# Patient Record
Sex: Female | Born: 2005 | Race: White | Hispanic: No | Marital: Single | Smoking: Never smoker
Health system: Southern US, Community
[De-identification: ages and names within clinical notes are randomized; demographics above are authoritative.]

## PROBLEM LIST (undated history)

## (undated) ENCOUNTER — Ambulatory Visit: Payer: Self-pay | Source: Home / Self Care

## (undated) DIAGNOSIS — R42 Dizziness and giddiness: Secondary | ICD-10-CM

## (undated) DIAGNOSIS — R232 Flushing: Secondary | ICD-10-CM

## (undated) DIAGNOSIS — R634 Abnormal weight loss: Secondary | ICD-10-CM

## (undated) DIAGNOSIS — R002 Palpitations: Secondary | ICD-10-CM

## (undated) DIAGNOSIS — R55 Syncope and collapse: Secondary | ICD-10-CM

## (undated) DIAGNOSIS — F419 Anxiety disorder, unspecified: Secondary | ICD-10-CM

## (undated) HISTORY — DX: Syncope and collapse: R55

## (undated) HISTORY — DX: Abnormal weight loss: R63.4

## (undated) HISTORY — DX: Flushing: R23.2

## (undated) HISTORY — DX: Dizziness and giddiness: R42

## (undated) HISTORY — PX: WISDOM TOOTH EXTRACTION: SHX21

## (undated) HISTORY — DX: Palpitations: R00.2

## (undated) HISTORY — DX: Anxiety disorder, unspecified: F41.9

---
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
## 2023-01-01 DIAGNOSIS — F411 Generalized anxiety disorder: Secondary | ICD-10-CM | POA: Diagnosis not present

## 2023-01-15 DIAGNOSIS — Z419 Encounter for procedure for purposes other than remedying health state, unspecified: Secondary | ICD-10-CM | POA: Diagnosis not present

## 2023-01-17 DIAGNOSIS — F411 Generalized anxiety disorder: Secondary | ICD-10-CM | POA: Diagnosis not present

## 2023-01-27 DIAGNOSIS — F411 Generalized anxiety disorder: Secondary | ICD-10-CM | POA: Diagnosis not present

## 2023-02-03 DIAGNOSIS — F411 Generalized anxiety disorder: Secondary | ICD-10-CM | POA: Diagnosis not present

## 2023-02-15 DIAGNOSIS — Z419 Encounter for procedure for purposes other than remedying health state, unspecified: Secondary | ICD-10-CM | POA: Diagnosis not present

## 2023-02-20 DIAGNOSIS — F321 Major depressive disorder, single episode, moderate: Secondary | ICD-10-CM | POA: Diagnosis not present

## 2023-02-20 DIAGNOSIS — F411 Generalized anxiety disorder: Secondary | ICD-10-CM | POA: Diagnosis not present

## 2023-02-24 DIAGNOSIS — F411 Generalized anxiety disorder: Secondary | ICD-10-CM | POA: Diagnosis not present

## 2023-03-04 DIAGNOSIS — F331 Major depressive disorder, recurrent, moderate: Secondary | ICD-10-CM | POA: Diagnosis not present

## 2023-03-13 DIAGNOSIS — F331 Major depressive disorder, recurrent, moderate: Secondary | ICD-10-CM | POA: Diagnosis not present

## 2023-03-15 DIAGNOSIS — Z419 Encounter for procedure for purposes other than remedying health state, unspecified: Secondary | ICD-10-CM | POA: Diagnosis not present

## 2023-04-21 ENCOUNTER — Ambulatory Visit: Payer: Medicaid Other | Admitting: Family Medicine

## 2023-04-30 ENCOUNTER — Telehealth: Payer: Self-pay

## 2023-04-30 ENCOUNTER — Ambulatory Visit
Admission: EM | Admit: 2023-04-30 | Discharge: 2023-04-30 | Disposition: A | Discharge status: Home / Self Care | Payer: Self-pay | Attending: Physician Assistant | Admitting: Physician Assistant

## 2023-04-30 VITALS — BP 106/74 | HR 91 | Temp 98.2°F | Resp 18 | Ht 65.5 in | Wt 219.1 lb

## 2023-04-30 DIAGNOSIS — Z654 Victim of crime and terrorism: Secondary | ICD-10-CM

## 2023-04-30 DIAGNOSIS — J029 Acute pharyngitis, unspecified: Secondary | ICD-10-CM | POA: Insufficient documentation

## 2023-04-30 DIAGNOSIS — Z9189 Other specified personal risk factors, not elsewhere classified: Secondary | ICD-10-CM | POA: Insufficient documentation

## 2023-04-30 LAB — POCT RAPID STREP A (OFFICE): Rapid Strep A Screen: NEGATIVE
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
## 2023-04-30 NOTE — ED Triage Notes (Signed)
"  Starting about 3 days ago started with scratchy throat, swelling, redness, I did poke a spot on my left tonsil that was swollen and it started bleeding, quickly stopped but not sure what that was". No fever known.

## 2023-05-02 ENCOUNTER — Telehealth: Payer: Self-pay | Admitting: *Deleted

## 2023-05-02 ENCOUNTER — Other Ambulatory Visit: Payer: Self-pay | Admitting: *Deleted

## 2023-05-02 LAB — CULTURE, GROUP A STREP (THRC)
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
## 2023-05-02 NOTE — Progress Notes (Signed)
 Complex Care Management Note  Care Guide Note 05/02/2023 Name: Julie Brady MRN: [id number] DOB: 2005/12/26  Julie Brady is a 18 y.o. year old female who sees Patient, No Pcp Per for primary care. I reached out to Clariza Libbey by phone today to offer complex care management services.  [name] was given information about Complex Care Management services today including:   The Complex Care Management services include support from the care team which includes your Nurse Care Manager, Clinical Social Worker, or Pharmacist.  The Complex Care Management team is here to help remove barriers to the health concerns and goals most important to you. Complex Care Management services are voluntary, and the patient may decline or stop services at any time by request to their care team member.   Complex Care Management Consent Status: Patient agreed to services and verbal consent obtained.   Follow up plan:  Telephone appointment with complex care management team member scheduled for:  05/02/2023  Encounter Outcome:  Patient Scheduled  Thedford Franks, CMA [place]  [facility], [facility] Guide Direct Dial: [phone]  Fax: [phone] Website: [email or web address]

## 2023-05-02 NOTE — Progress Notes (Signed)
 Complex Care Management Note Care Guide Note  05/02/2023 Name: Julie Brady MRN: [id number] DOB: 10/24/2005   Complex Care Management Outreach Attempts: An unsuccessful telephone outreach was attempted today to offer the patient information about available complex care management services.  Follow Up Plan:  Additional outreach attempts will be made to offer the patient complex care management information and services.   Encounter Outcome:  No Answer  Thedford Franks, CMA [place]  [facility], [facility] Guide Direct Dial: [phone]  Fax: [phone] Website: [email or web address]

## 2023-09-21 ENCOUNTER — Other Ambulatory Visit: Payer: Self-pay

## 2023-09-21 ENCOUNTER — Ambulatory Visit
Admission: EM | Admit: 2023-09-21 | Discharge: 2023-09-21 | Disposition: A | Discharge status: Home / Self Care | Attending: Emergency Medicine | Admitting: Emergency Medicine

## 2023-09-21 DIAGNOSIS — J069 Acute upper respiratory infection, unspecified: Secondary | ICD-10-CM

## 2023-09-21 LAB — POCT INFLUENZA A/B
Influenza A, POC: NEGATIVE
Influenza B, POC: NEGATIVE

## 2023-09-21 LAB — POC SOFIA SARS ANTIGEN FIA: SARS Coronavirus 2 Ag: NEGATIVE

## 2023-09-21 MED ORDER — CYCLOBENZAPRINE HCL 10 MG PO TABS: 10.0000 mg | ORAL_TABLET | Freq: Every evening | ORAL | 0 refills | Status: AC

## 2023-09-21 MED ORDER — IBUPROFEN 600 MG PO TABS: 600.0000 mg | ORAL_TABLET | Freq: Four times a day (QID) | ORAL | 0 refills | Status: AC | PRN

## 2023-09-21 NOTE — ED Triage Notes (Signed)
 Pt reports body aches, headache, chills, stuffy nose since Thursday. Denies known fever. Taking dayquil, nyquil, ibuprofen 

## 2023-09-21 NOTE — ED Provider Notes (Signed)
 [facility]    CSN: [id number] Arrival date & time: 09/21/23  1320      History   Chief Complaint Chief Complaint  Patient presents with   Generalized Body Aches   Nasal Congestion    HPI Julie Brady is a 18 y.o. female.  3-4 day history of chills, nasal congestion, body aches and headaches No known fevers but did feel sweaty  No vomiting/diarrhea or abd pain Not coughing  Has used dayquil/nyquil and ibuprofen    Possible sick contacts. Works with children   Past Medical History:  Diagnosis Date   Anxiety    Dizziness    Hot flashes    Palpitations    Syncope    Weight loss     Patient Active Problem List   Diagnosis Date Noted   Obesity 12/13/2022   Failed vision screen 12/13/2022   Adjustment disorder with mixed anxiety and depressed mood 12/13/2022   Major depressive disorder, single episode, moderate (HCC) 10/22/2022   Generalized anxiety disorder 10/22/2022   IBS (irritable bowel syndrome) 05/10/2020    Past Surgical History:  Procedure Laterality Date   WISDOM TOOTH EXTRACTION      OB History   No obstetric history on file.      Home Medications    Prior to Admission medications   Medication Sig Start Date End Date Taking? Authorizing Provider  busPIRone (BUSPAR) 15 MG tablet Take 15 mg by mouth 2 (two) times daily. 12/04/22  Yes [provider]  cyclobenzaprine  (FLEXERIL ) 10 MG tablet Take 1 tablet (10 mg total) by mouth at bedtime. 09/21/23  Yes Archimedes Harold, [place], PA-C  escitalopram (LEXAPRO) 10 MG tablet Take 10 mg by mouth daily.   Yes [provider]  hydrOXYzine (VISTARIL) 25 MG capsule Take 25-50 mg by mouth at bedtime as needed. 11/11/22  Yes [provider]  ibuprofen  (ADVIL ) 600 MG tablet Take 1 tablet (600 mg total) by mouth every 6 (six) hours as needed. 09/21/23  Yes Nefertiti Mohamad, [place], PA-C  albuterol  (PROVENTIL  HFA;VENTOLIN  HFA) 108 (90 BASE) MCG/ACT inhaler Inhale 1-2 puffs into the lungs every  6 (six) hours as needed for wheezing. 01/13/11 01/13/12  Van Knee, MD  hydrOXYzine (ATARAX) 25 MG tablet Take 25 mg by mouth every 8 (eight) hours as needed. 03/19/22   [provider]    Family History No family history on file.  Social History Social History   Tobacco Use   Smoking status: Never    Passive exposure: Yes   Smokeless tobacco: Never  Vaping Use   Vaping status: Some Days   Substances: Nicotine, Flavoring  Substance Use Topics   Alcohol use: Not Currently   Drug use: Never     Allergies   Patient has no known allergies.   Review of Systems Review of Systems  As per HPI  Physical Exam Triage Vital Signs ED Triage Vitals  Encounter Vitals Group     BP 09/21/23 1458 115/80     Girls Systolic BP Percentile --      Girls Diastolic BP Percentile --      Boys Systolic BP Percentile --      Boys Diastolic BP Percentile --      Pulse Rate 09/21/23 1458 89     Resp 09/21/23 1458 18     Temp 09/21/23 1458 98.2 F (36.8 C)     Temp Source 09/21/23 1458 Oral     SpO2 09/21/23 1458 98 %     Weight --  Height --      Head Circumference --      Peak Flow --      Pain Score 09/21/23 1456 5     Pain Loc --      Pain Education --      Exclude from Growth Chart --    No data found.  Updated Vital Signs BP 115/80 (BP Location: Left Arm)   Pulse 89   Temp 98.2 F (36.8 C) (Oral)   Resp 18   LMP 09/18/2023   SpO2 98%    Physical Exam Vitals and nursing note reviewed.  HENT:     Right Ear: Tympanic membrane and ear canal normal.     Left Ear: Tympanic membrane and ear canal normal.     Nose: Congestion and rhinorrhea present.     Mouth/Throat:     Mouth: Mucous membranes are moist.     Pharynx: Oropharynx is clear. No posterior oropharyngeal erythema.  Eyes:     Conjunctiva/sclera: Conjunctivae normal.  Cardiovascular:     Rate and Rhythm: Normal rate and regular rhythm.     Pulses: Normal pulses.     Heart sounds: Normal  heart sounds.  Pulmonary:     Effort: Pulmonary effort is normal.     Breath sounds: Normal breath sounds.  Abdominal:     Palpations: Abdomen is soft.     Tenderness: There is no abdominal tenderness.  Musculoskeletal:     Cervical back: Normal range of motion.  Lymphadenopathy:     Cervical: No cervical adenopathy.  Skin:    General: Skin is warm and dry.  Neurological:     Mental Status: She is alert and oriented to person, place, and time.     UC Treatments / Results  Labs (all labs ordered are listed, but only abnormal results are displayed) Labs Reviewed  POCT INFLUENZA A/B  POC SOFIA SARS ANTIGEN FIA    EKG   Radiology No results found.  Procedures Procedures (including critical care time)  Medications Ordered in UC Medications - No data to display  Initial Impression / Assessment and Plan / UC Course  I have reviewed the triage vital signs and the nursing notes.  Pertinent labs & imaging results that were available during my care of the patient were reviewed by me and considered in my medical decision making (see chart for details).  Afebrile in clinic Clear lungs Rapid flu and covid are negative Likely other viral etiology.  Supportive care discussed Continue ibuprofen  Try flexeril  at bedtime for body aches  Other OTC options  Return precautions Note for work is provided  Final Clinical Impressions(s) / UC Diagnoses   Final diagnoses:  Viral URI     Discharge Instructions      The covid and flu tests today were negative You likely have a different respiratory virus   Continue ibuprofen  - 1 tablet every 6 hours as needed You can also alternate with tylenol  You can take the muscle relaxer Flexeril  at bed time. (May make you drowsy)   Keep hydrated Allow a few more days for symptom improvement!     ED Prescriptions     Medication Sig Dispense Auth. Provider   ibuprofen  (ADVIL ) 600 MG tablet Take 1 tablet (600 mg total) by mouth  every 6 (six) hours as needed. 30 tablet Ora Mcnatt, PA-C   cyclobenzaprine  (FLEXERIL ) 10 MG tablet Take 1 tablet (10 mg total) by mouth at bedtime. 20 tablet Khamia Stambaugh, [place], PA-C  PDMP not reviewed this encounter.   Cortez Steelman, [place], PA-C 09/21/23 1524

## 2023-09-21 NOTE — Discharge Instructions (Addendum)
 The covid and flu tests today were negative You likely have a different respiratory virus   Continue ibuprofen  - 1 tablet every 6 hours as needed You can also alternate with tylenol  You can take the muscle relaxer Flexeril  at bed time. (May make you drowsy)   Keep hydrated Allow a few more days for symptom improvement!

## 2023-09-26 DIAGNOSIS — Z419 Encounter for procedure for purposes other than remedying health state, unspecified: Secondary | ICD-10-CM | POA: Diagnosis not present

## 2023-11-24 ENCOUNTER — Encounter: Payer: Self-pay | Admitting: Emergency Medicine

## 2023-11-24 ENCOUNTER — Ambulatory Visit: Admission: EM | Admit: 2023-11-24 | Discharge: 2023-11-24 | Disposition: A | Discharge status: Home / Self Care

## 2023-11-24 DIAGNOSIS — J069 Acute upper respiratory infection, unspecified: Secondary | ICD-10-CM

## 2023-11-24 LAB — POC COVID19/FLU A&B COMBO
Covid Antigen, POC: NEGATIVE
Influenza A Antigen, POC: NEGATIVE
Influenza B Antigen, POC: NEGATIVE

## 2023-11-24 NOTE — ED Provider Notes (Signed)
 [facility]    CSN: [id number] Arrival date & time: 11/24/23  1035      History   Chief Complaint Chief Complaint  Patient presents with   Sore Throat   Nasal Congestion   Fever    HPI Julie Brady is a 18 y.o. female.   Patient presents today due to nasal congestion, body aches, and throat pain that started yesterday.  Patient is that she took DayQuil and NyQuil for symptoms without significant relief.  Patient denies known sick contacts.  Patient denies changes in appetite, chills, nausea, or vomiting.  States that temperature at home was 100.1.  The history is provided by the patient.  Sore Throat  Fever   Past Medical History:  Diagnosis Date   Anxiety    Dizziness    Hot flashes    Palpitations    Syncope    Weight loss     Patient Active Problem List   Diagnosis Date Noted   Obesity 12/13/2022   Failed vision screen 12/13/2022   Adjustment disorder with mixed anxiety and depressed mood 12/13/2022   Major depressive disorder, single episode, moderate (HCC) 10/22/2022   Generalized anxiety disorder 10/22/2022   IBS (irritable bowel syndrome) 05/10/2020    Past Surgical History:  Procedure Laterality Date   WISDOM TOOTH EXTRACTION      OB History   No obstetric history on file.      Home Medications    Prior to Admission medications   Medication Sig Start Date End Date Taking? Authorizing Provider  albuterol  (PROVENTIL  HFA;VENTOLIN  HFA) 108 (90 BASE) MCG/ACT inhaler Inhale 1-2 puffs into the lungs every 6 (six) hours as needed for wheezing. 01/13/11 01/13/12  Van Knee, MD  busPIRone (BUSPAR) 15 MG tablet Take 15 mg by mouth 2 (two) times daily. 12/04/22   [provider]  cyclobenzaprine  (FLEXERIL ) 10 MG tablet Take 1 tablet (10 mg total) by mouth at bedtime. 09/21/23   Rising, Asberry, PA-C  escitalopram (LEXAPRO) 10 MG tablet Take 10 mg by mouth daily.    [provider]  hydrOXYzine (ATARAX) 25 MG  tablet Take 25 mg by mouth every 8 (eight) hours as needed. 03/19/22   [provider]  hydrOXYzine (VISTARIL) 25 MG capsule Take 25-50 mg by mouth at bedtime as needed. 11/11/22   [provider]  ibuprofen  (ADVIL ) 600 MG tablet Take 1 tablet (600 mg total) by mouth every 6 (six) hours as needed. 09/21/23   Rising, Asberry PA-C    Family History No family history on file.  Social History Social History   Tobacco Use   Smoking status: Never    Passive exposure: Yes   Smokeless tobacco: Never  Vaping Use   Vaping status: Some Days   Substances: Nicotine, Flavoring  Substance Use Topics   Alcohol use: Not Currently   Drug use: Never     Allergies   Patient has no known allergies.   Review of Systems Review of Systems  Constitutional:  Positive for fever.     Physical Exam Triage Vital Signs ED Triage Vitals  Encounter Vitals Group     BP 11/24/23 1155 130/88     Girls Systolic BP Percentile --      Girls Diastolic BP Percentile --      Boys Systolic BP Percentile --      Boys Diastolic BP Percentile --      Pulse Rate 11/24/23 1155 (!) 114     Resp 11/24/23 1155 16  Temp 11/24/23 1155 97.6 F (36.4 C)     Temp Source 11/24/23 1155 Oral     SpO2 11/24/23 1155 97 %     Weight 11/24/23 1156 180 lb (81.6 kg)     Height 11/24/23 1156 5' 6 (1.676 m)     Head Circumference --      Peak Flow --      Pain Score 11/24/23 1155 3     Pain Loc --      Pain Education --      Exclude from Growth Chart --    No data found.  Updated Vital Signs BP 130/88 (BP Location: Left Arm)   Pulse (!) 114   Temp 97.6 F (36.4 C) (Oral)   Resp 16   Ht 5' 6 (1.676 m)   Wt 180 lb (81.6 kg)   LMP 11/15/2023 (Exact Date)   SpO2 97%   BMI 29.05 kg/m   Visual Acuity Right Eye Distance:   Left Eye Distance:   Bilateral Distance:    Right Eye Near:   Left Eye Near:    Bilateral Near:     Physical Exam Vitals and nursing note reviewed.  Constitutional:       General: She is not in acute distress.    Appearance: Normal appearance. She is not ill-appearing, toxic-appearing or diaphoretic.  HENT:     Nose: Congestion (moderately enlarged turbinates) present. No rhinorrhea.     Mouth/Throat:     Mouth: Mucous membranes are moist.     Pharynx: Oropharynx is clear. No oropharyngeal exudate or posterior oropharyngeal erythema.  Eyes:     General: No scleral icterus. Cardiovascular:     Rate and Rhythm: Normal rate and regular rhythm.  Neurological:     Mental Status: She is alert.      UC Treatments / Results  Labs (all labs ordered are listed, but only abnormal results are displayed) Labs Reviewed  POC COVID19/FLU A&B COMBO - Normal    EKG   Radiology No results found.  Procedures Procedures (including critical care time)  Medications Ordered in UC Medications - No data to display  Initial Impression / Assessment and Plan / UC Course  I have reviewed the triage vital signs and the nursing notes.  Pertinent labs & imaging results that were available during my care of the patient were reviewed by me and considered in my medical decision making (see chart for details).     Final Clinical Impressions(s) / UC Diagnoses   Final diagnoses:  Viral URI     Discharge Instructions      You been diagnosed with a viral illness today. -Viruses have to run their course and medicines that are prescribed are meant to help with symptoms. - With viruses usually feel poorly from 3 to 7 days with cough being the last symptoms to resolve.  -Cough can linger from days to weeks.  Antibiotics are not effective for viruses. -If your cough lasts more than 2 weeks and you are coughing so hard that you are vomiting or feel like you could pass out we need to follow-up with PCP for further testing and evaluation. -Rest, increase water intake, may use pseudoephedrine for nasal congestion, Delsym (dextromethorphan) or honey as needed for cough,  and ibuprofen  and/or Tylenol as directed on packaging for pain and fever. -If you have hypertension you should take Coricidin or other OTC meds approved for people with high blood pressure. -You may use a spoonful of honey every 4-6  hours as needed for throat pain and cough. -Warm tea with honey and lemon are helpful for soothe throat as well.  Chloraseptic and Cepacol make a throat lozenge with numbing medication, can be purchased over-the-counter. -May also use Flonase or sinus rinse for sinus pressure or nasal congestion.  Be sure to use distilled bottled water for sinus rinses. -May use coolmist humidifier to open up nasal passages -May elevate head to assist with postnasal drainage. -If you feel poorly (fever, fatigue, shortness of breath, nausea, etc.) for more than 10 days to be sure to follow-up with PCP or in clinic for further evaluation and additional treatments. If you experience chest pain with shortness of breath or pulse oxygen less than 95% you should report to the ER.     ED Prescriptions   None    PDMP not reviewed this encounter.   Andra Corean BROCKS, PA-C 11/24/23 1401

## 2023-11-24 NOTE — Discharge Instructions (Signed)

## 2023-11-24 NOTE — ED Triage Notes (Signed)
 Pt c/o sore throat, nasal drainage, elevated temp and body aches.Onset yesterday
